# Patient Record
Sex: Female | Born: 1995 | Race: White | Hispanic: No | Marital: Single | Smoking: Never smoker
Health system: Southern US, Community
[De-identification: ages and names within clinical notes are randomized; demographics above are authoritative.]

## PROBLEM LIST (undated history)

## (undated) DIAGNOSIS — J302 Other seasonal allergic rhinitis: Secondary | ICD-10-CM

## (undated) DIAGNOSIS — T7840XA Allergy, unspecified, initial encounter: Secondary | ICD-10-CM

## (undated) HISTORY — PX: TEAR DUCT PROBING: SHX793

## (undated) HISTORY — DX: Allergy, unspecified, initial encounter: T78.40XA

## (undated) HISTORY — PX: OTHER SURGICAL HISTORY: SHX169

## (undated) HISTORY — DX: Other seasonal allergic rhinitis: J30.2

---
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
## 2016-01-27 ENCOUNTER — Other Ambulatory Visit: Payer: Self-pay | Admitting: Physician Assistant

## 2016-01-27 DIAGNOSIS — J4520 Mild intermittent asthma, uncomplicated: Secondary | ICD-10-CM

## 2016-01-27 DIAGNOSIS — IMO0001 Reserved for inherently not codable concepts without codable children: Secondary | ICD-10-CM

## 2016-01-27 DIAGNOSIS — J302 Other seasonal allergic rhinitis: Secondary | ICD-10-CM

## 2016-01-27 MED ORDER — ALBUTEROL SULFATE HFA 108 (90 BASE) MCG/ACT IN AERS: 2.0000 | INHALATION_SPRAY | Freq: Four times a day (QID) | RESPIRATORY_TRACT | Status: DC | PRN

## 2016-01-27 MED ORDER — FLUTICASONE-SALMETEROL 250-50 MCG/DOSE IN AEPB: 1.0000 | INHALATION_SPRAY | Freq: Two times a day (BID) | RESPIRATORY_TRACT | Status: DC

## 2016-01-27 MED ORDER — CETIRIZINE HCL 10 MG PO TABS: 10.0000 mg | ORAL_TABLET | Freq: Every day | ORAL | Status: DC

## 2016-01-27 MED ORDER — DESOGESTREL-ETHINYL ESTRADIOL 0.15-0.02/0.01 MG (21/5) PO TABS: 1.0000 | ORAL_TABLET | Freq: Every day | ORAL | Status: DC

## 2016-01-27 MED ORDER — MONTELUKAST SODIUM 10 MG PO TABS: 10.0000 mg | ORAL_TABLET | Freq: Every day | ORAL | Status: DC

## 2016-02-02 ENCOUNTER — Encounter: Payer: Managed Care, Other (non HMO) | Admitting: Physician Assistant

## 2016-02-02 ENCOUNTER — Encounter: Payer: Self-pay | Admitting: Physician Assistant

## 2016-02-02 NOTE — Patient Instructions (Signed)
     IF you received an x-ray today, you will receive an invoice from [facility]. Please contact [facility] at [phone] with questions or concerns regarding your invoice.   IF you received labwork today, you will receive an invoice from [facility]. Please contact [facility] at [phone] with questions or concerns regarding your invoice.   Our billing staff will not be able to assist you with questions regarding bills from these companies.  You will be contacted with the lab results as soon as they are available. The fastest way to get your results is to activate your My Chart account. Instructions are located on the last page of this paperwork. If you have not heard from us regarding the results in 2 weeks, please contact this office.      

## 2016-02-03 ENCOUNTER — Telehealth: Payer: Self-pay

## 2016-02-03 DIAGNOSIS — IMO0001 Reserved for inherently not codable concepts without codable children: Secondary | ICD-10-CM

## 2016-02-03 DIAGNOSIS — J4520 Mild intermittent asthma, uncomplicated: Secondary | ICD-10-CM

## 2016-02-03 DIAGNOSIS — J302 Other seasonal allergic rhinitis: Secondary | ICD-10-CM

## 2016-02-03 MED ORDER — CETIRIZINE HCL 10 MG PO TABS: 10.0000 mg | ORAL_TABLET | Freq: Every day | ORAL | 0 refills | Status: DC

## 2016-02-03 MED ORDER — FLUTICASONE-SALMETEROL 250-50 MCG/DOSE IN AEPB: 1.0000 | INHALATION_SPRAY | Freq: Two times a day (BID) | RESPIRATORY_TRACT | 0 refills | Status: DC

## 2016-02-03 MED ORDER — DESOGESTREL-ETHINYL ESTRADIOL 0.15-0.02/0.01 MG (21/5) PO TABS: 1.0000 | ORAL_TABLET | Freq: Every day | ORAL | 0 refills | Status: DC

## 2016-02-03 MED ORDER — ALBUTEROL SULFATE HFA 108 (90 BASE) MCG/ACT IN AERS: 2.0000 | INHALATION_SPRAY | Freq: Four times a day (QID) | RESPIRATORY_TRACT | 0 refills | Status: AC | PRN

## 2016-02-03 MED ORDER — MONTELUKAST SODIUM 10 MG PO TABS: 10.0000 mg | ORAL_TABLET | Freq: Every day | ORAL | 0 refills | Status: DC

## 2016-02-03 NOTE — Telephone Encounter (Signed)
Pt's mother called because pt tried to get in to see Maralyn Sago yesterday before going back to schoool and couldn't wait due to work. Pt was told that all of her Rxs were already sent in. In review, the RFs were sent to [facility] which mother states they no longer use. The RFs need to go to [facility]. Advised I will resend there and have pt get back in to see Maralyn Sago as soon as possible for f/up. Mother asks for 90 day supplies because they are less expensive.

## 2016-02-04 NOTE — Progress Notes (Signed)
Pt was triaged but had to leave prior to being seen by provider.  This encounter was created in error - please disregard.

## 2016-04-18 ENCOUNTER — Other Ambulatory Visit: Payer: Self-pay | Admitting: Physician Assistant

## 2016-04-18 NOTE — Telephone Encounter (Signed)
Unable to reach patient to see what follow/up plan is

## 2016-04-20 ENCOUNTER — Other Ambulatory Visit: Payer: Self-pay | Admitting: Physician Assistant

## 2016-04-20 MED ORDER — DESOGESTREL-ETHINYL ESTRADIOL 0.15-0.02/0.01 MG (21/5) PO TABS: 1.0000 | ORAL_TABLET | Freq: Every day | ORAL | 0 refills | Status: DC

## 2016-04-30 ENCOUNTER — Other Ambulatory Visit: Payer: Self-pay | Admitting: Physician Assistant

## 2016-04-30 DIAGNOSIS — IMO0001 Reserved for inherently not codable concepts without codable children: Secondary | ICD-10-CM

## 2016-04-30 DIAGNOSIS — J4520 Mild intermittent asthma, uncomplicated: Secondary | ICD-10-CM

## 2016-04-30 DIAGNOSIS — J302 Other seasonal allergic rhinitis: Secondary | ICD-10-CM

## 2016-06-24 ENCOUNTER — Ambulatory Visit (INDEPENDENT_AMBULATORY_CARE_PROVIDER_SITE_OTHER): Payer: No Typology Code available for payment source | Admitting: Family Medicine

## 2016-06-24 VITALS — BP 104/66 | HR 65 | Temp 98.5°F | Resp 18 | Ht 66.01 in | Wt 168.0 lb

## 2016-06-24 DIAGNOSIS — R300 Dysuria: Secondary | ICD-10-CM | POA: Diagnosis not present

## 2016-06-24 DIAGNOSIS — Z23 Encounter for immunization: Secondary | ICD-10-CM

## 2016-06-24 LAB — POCT URINALYSIS DIP (MANUAL ENTRY)
BILIRUBIN UA: NEGATIVE
Bilirubin, UA: NEGATIVE
GLUCOSE UA: NEGATIVE
Nitrite, UA: POSITIVE — AB
PROTEIN UA: NEGATIVE
UROBILINOGEN UA: 0.2
pH, UA: 7

## 2016-06-24 LAB — POC MICROSCOPIC URINALYSIS (UMFC): Mucus: ABSENT

## 2016-06-24 MED ORDER — CEPHALEXIN 500 MG PO CAPS: 500.0000 mg | ORAL_CAPSULE | Freq: Two times a day (BID) | ORAL | 0 refills | Status: DC

## 2016-06-24 NOTE — Progress Notes (Signed)
Shelly Nichols is a 20 y.o. female who presents to [facility] today with complaints concerning for UTI:  1.  Concern for UTI: Shelly Nichols is a 20 y.o. female who complains of urinary frequency, urgency and dysuria x 3 days.  She denies any flank or back pain, fever, chills, vomiting, or abnormal vaginal discharge/itching/bleeding.  Does endorse some suprapubic pressure.  No history of incontinence.  Now with nocturia 1-2 as well, which is new for her.  The following portions of the patient's history were reviewed and updated as appropriate: allergies, current medications, past medical history, family and social history, and problem list.  Patient is a nonsmoker.    Past Medical History:  Diagnosis Date  . Allergy   . Asthma   . Seasonal allergies    Past Surgical History:  Procedure Laterality Date  . myringotomy     times 2  . right lens implant     age 89  . TEAR DUCT PROBING      Medications reviewed. Current Outpatient Prescriptions  Medication Sig Dispense Refill  . ADVAIR DISKUS 250-50 MCG/DOSE AEPB INHALE 1 PUFF INTO THE LUNGS EVERY 12 (TWELVE) HOURS. 180 each 0  . albuterol (PROVENTIL HFA;VENTOLIN HFA) 108 (90 Base) MCG/ACT inhaler Inhale 2 puffs into the lungs every 6 (six) hours as needed. 3 Inhaler 0  . cetirizine (ZYRTEC) 10 MG tablet TAKE 1 TABLET (10 MG TOTAL) BY MOUTH DAILY. 90 tablet 0  . desogestrel-ethinyl estradiol (VIORELE) 0.15-0.02/0.01 MG (21/5) tablet Take 1 tablet by mouth daily. 84 tablet 0  . ibuprofen (ADVIL,MOTRIN) 800 MG tablet Take 800 mg by mouth 2 (two) times daily. Reported on 11/09/2015    . montelukast (SINGULAIR) 10 MG tablet Take 1 tablet (10 mg total) by mouth at bedtime. 90 tablet 0  . triamcinolone (NASACORT AQ) 55 MCG/ACT AERO nasal inhaler Place 2 sprays into the nose daily. 1 Inhaler 0   No current facility-administered medications for this visit.     ROS as above otherwise neg.       Objective:   Physical Exam BP 104/66 (BP Location: Right Arm,  Patient Position: Sitting, Cuff Size: Small)   Pulse 65   Temp 98.5 F (36.9 C) (Oral)   Resp 18   Ht 5' 6.01" (1.677 m)   Wt 168 lb (76.2 kg)   LMP 06/02/2016   SpO2 99%   BMI 27.11 kg/m  Gen:  Alert, cooperative patient who appears stated age in no acute distress.  Vital signs reviewed. Mouth: MMM Cardiac:  Regular rate and rhythm without murmur auscultated.  Good S1/S2. Pulm:  Clear to auscultation bilaterally with good air movement.  No wheezes or rales noted.   Abdomen:  Soft/ND/NT.  No CVA tenderness bilaterally   Results for orders placed or performed in visit on 06/24/16  POCT Microscopic Urinalysis ([facility])  Result Value Ref Range   WBC,UR,HPF,POC Few (A) None WBC/hpf   RBC,UR,HPF,POC None None RBC/hpf   Bacteria None None, Too numerous to count   Mucus Absent Absent   Epithelial Cells, UR Per Microscopy Moderate (A) None, Too numerous to count cells/hpf  POCT urinalysis dipstick  Result Value Ref Range   Color, UA yellow yellow   Clarity, UA clear clear   Glucose, UA negative negative   Bilirubin, UA negative negative   Ketones, POC UA negative negative   Spec Grav, UA <=1.005    Blood, UA small (A) negative   pH, UA 7.0    Protein Ur, POC negative  negative   Urobilinogen, UA 0.2    Nitrite, UA Positive (A) Negative   Leukocytes, UA small (1+) (A) Negative     Imp/Plan: 1.  UTI: Treatment with keflex x 7 days. Treat based on symptoms and U/A. Pyridium prn. Call or return to clinic prn if these symptoms worsen or fail to improve as anticipated, or if she begins to have any back pain, fevers, or chills.

## 2016-06-24 NOTE — Addendum Note (Signed)
Addended byGwendolyn Grant: Delane Stalling H on: 06/24/2016 11:06 AM   Modules accepted: Orders

## 2016-06-24 NOTE — Patient Instructions (Addendum)
  It does indeed look like you have a urinary tract infection.  Take the Keflex twice daily for the next 7 days.   We will send this for culture to make sure the antibiotics are the right ones.    If you start having any worsening pain, vomiting, unable to hold down fluids, fevers, or other concerns, do not hesitate to come back or go to the ED after hours.    It was good to see you today!    IF you received an x-ray today, you will receive an invoice from [facility]. Please contact [facility] at [phone] with questions or concerns regarding your invoice.   IF you received labwork today, you will receive an invoice from [facility]. Please contact [facility] at [phone] with questions or concerns regarding your invoice.   Our billing staff will not be able to assist you with questions regarding bills from these companies.  You will be contacted with the lab results as soon as they are available. The fastest way to get your results is to activate your My Chart account. Instructions are located on the last page of this paperwork. If you have not heard from us regarding the results in 2 weeks, please contact this office.

## 2016-06-26 LAB — URINE CULTURE

## 2016-07-05 ENCOUNTER — Ambulatory Visit (INDEPENDENT_AMBULATORY_CARE_PROVIDER_SITE_OTHER): Payer: No Typology Code available for payment source | Admitting: Physician Assistant

## 2016-07-05 VITALS — BP 106/62 | HR 74 | Temp 98.9°F | Resp 16 | Ht 66.0 in | Wt 169.0 lb

## 2016-07-05 DIAGNOSIS — N946 Dysmenorrhea, unspecified: Secondary | ICD-10-CM

## 2016-07-05 NOTE — Progress Notes (Signed)
   Shelly CrockerMegan Nichols  MRN: [id number] DOB: 01/23/1996  Subjective:  Pt presents to clinic for medication refill.  She has noticed the last 6 months she has noticed that her menstrual cramping has started to increase which has been controlled since she started on OCP - over the last month is still not bothering her that much but the last month (she had a UTI at the same time) the cramps were unbearable and she does want that to continue.  She does not miss pills.   Review of Systems  Constitutional: Negative for chills and fever.  Genitourinary: Positive for menstrual problem.    Patient Active Problem List   Diagnosis Date Noted  . Low back pain 11/09/2015  . Asthma, moderate persistent, well-controlled   . Seasonal allergies     Current Outpatient Prescriptions on File Prior to Visit  Medication Sig Dispense Refill  . ADVAIR DISKUS 250-50 MCG/DOSE AEPB INHALE 1 PUFF INTO THE LUNGS EVERY 12 (TWELVE) HOURS. 180 each 0  . albuterol (PROVENTIL HFA;VENTOLIN HFA) 108 (90 Base) MCG/ACT inhaler Inhale 2 puffs into the lungs every 6 (six) hours as needed. 3 Inhaler 0  . cetirizine (ZYRTEC) 10 MG tablet TAKE 1 TABLET (10 MG TOTAL) BY MOUTH DAILY. 90 tablet 0  . desogestrel-ethinyl estradiol (VIORELE) 0.15-0.02/0.01 MG (21/5) tablet Take 1 tablet by mouth daily. 84 tablet 0  . ibuprofen (ADVIL,MOTRIN) 800 MG tablet Take 800 mg by mouth 2 (two) times daily. Reported on 11/09/2015    . montelukast (SINGULAIR) 10 MG tablet Take 1 tablet (10 mg total) by mouth at bedtime. 90 tablet 0  . triamcinolone (NASACORT AQ) 55 MCG/ACT AERO nasal inhaler Place 2 sprays into the nose daily. 1 Inhaler 0   No current facility-administered medications on file prior to visit.     No Known Allergies  Pt patients past, family and social history were reviewed and updated.   Objective:  BP 106/62 (BP Location: Right Arm, Patient Position: Sitting, Cuff Size: Normal)   Pulse 74   Temp 98.9 F (37.2 C)   Resp 16   Ht 5'  6" (1.676 m)   Wt 169 lb (76.7 kg)   LMP 06/29/2016   SpO2 99%   BMI 27.28 kg/m   Physical Exam  Constitutional: She is oriented to person, place, and time and well-developed, well-nourished, and in no distress.  HENT:  Head: Normocephalic and atraumatic.  Right Ear: Hearing and external ear normal.  Left Ear: Hearing and external ear normal.  Eyes: Conjunctivae are normal.  Neck: Normal range of motion.  Pulmonary/Chest: Effort normal.  Neurological: She is alert and oriented to person, place, and time. Gait normal.  Skin: Skin is warm and dry.  Psychiatric: Mood, memory, affect and judgment normal.  Vitals reviewed.   Assessment and Plan :  Dysmenorrhea - unsure why the last menses cramps were so significant other than the fact that the UTI might have worsened the pain - she will continue her current pack of pills (she is on the 1st week) and she will let me know what her cramps are like - if there are again really bad we will switch to a different pill with more estrogen.    Benny LennertSarah Natoshia Souter PA-C  [facility] 07/05/2016 7:09 PM

## 2016-07-05 NOTE — Patient Instructions (Signed)
     IF you received an x-ray today, you will receive an invoice from [facility]. Please contact  Radiology at [phone] with questions or concerns regarding your invoice.   IF you received labwork today, you will receive an invoice from [facility]. Please contact [facility] at [phone] with questions or concerns regarding your invoice.   Our billing staff will not be able to assist you with questions regarding bills from these companies.  You will be contacted with the lab results as soon as they are available. The fastest way to get your results is to activate your My Chart account. Instructions are located on the last page of this paperwork. If you have not heard from us regarding the results in 2 weeks, please contact this office.     

## 2016-07-30 ENCOUNTER — Other Ambulatory Visit: Payer: Self-pay | Admitting: Physician Assistant

## 2016-07-30 DIAGNOSIS — IMO0001 Reserved for inherently not codable concepts without codable children: Secondary | ICD-10-CM

## 2016-07-30 DIAGNOSIS — J4520 Mild intermittent asthma, uncomplicated: Secondary | ICD-10-CM

## 2016-07-30 DIAGNOSIS — J302 Other seasonal allergic rhinitis: Secondary | ICD-10-CM

## 2016-07-30 MED ORDER — DESOGESTREL-ETHINYL ESTRADIOL 0.15-0.02/0.01 MG (21/5) PO TABS: 1.0000 | ORAL_TABLET | Freq: Every day | ORAL | 3 refills | Status: DC

## 2016-07-30 NOTE — Telephone Encounter (Signed)
Last ov for allergy 04/2015 needs ov

## 2016-09-04 ENCOUNTER — Other Ambulatory Visit: Payer: Self-pay | Admitting: Physician Assistant

## 2016-09-04 DIAGNOSIS — J302 Other seasonal allergic rhinitis: Secondary | ICD-10-CM

## 2016-09-04 DIAGNOSIS — IMO0001 Reserved for inherently not codable concepts without codable children: Secondary | ICD-10-CM

## 2016-09-04 DIAGNOSIS — J4520 Mild intermittent asthma, uncomplicated: Secondary | ICD-10-CM

## 2016-12-26 ENCOUNTER — Encounter: Payer: Self-pay | Admitting: Physician Assistant

## 2016-12-26 ENCOUNTER — Ambulatory Visit (INDEPENDENT_AMBULATORY_CARE_PROVIDER_SITE_OTHER): Payer: Managed Care, Other (non HMO) | Admitting: Physician Assistant

## 2016-12-26 VITALS — BP 108/71 | HR 60 | Temp 98.4°F | Resp 18 | Ht 66.0 in | Wt 158.6 lb

## 2016-12-26 DIAGNOSIS — N898 Other specified noninflammatory disorders of vagina: Secondary | ICD-10-CM

## 2016-12-26 MED ORDER — MONTELUKAST SODIUM 10 MG PO TABS: 10.0000 mg | ORAL_TABLET | Freq: Every day | ORAL | 3 refills | Status: AC

## 2016-12-26 NOTE — Progress Notes (Signed)
Shelly Nichols  MRN: [id number] DOB: 1995/07/23  PCP: Morrell Riddle, PA-C  Chief Complaint  Patient presents with  . female issues    Subjective:  Pt presents to clinic for burning sensation that occurs during every sexual encounter for the last about 7 months.  The pain occurs in any position but it is more on her left and more inside of her vagina vs at the introitus.  It does get more uncomfortable the longer she has sex.  Uses condoms - has tried without condoms but that does not to discomfort.  With only 1 partner - she has not had STD testing.  She is having no vaginal discharge or dysuria.  Review of Systems  Genitourinary: Positive for vaginal pain (just during sex - burning sensation). Negative for menstrual problem and vaginal discharge.    Patient Active Problem List   Diagnosis Date Noted  . Low back pain 11/09/2015  . Asthma, moderate persistent, well-controlled   . Seasonal allergies     Current Outpatient Prescriptions on File Prior to Visit  Medication Sig Dispense Refill  . ADVAIR DISKUS 250-50 MCG/DOSE AEPB INHALE 1 PUFF INTO THE LUNGS EVERY 12 (TWELVE) HOURS. 180 each 1  . albuterol (PROVENTIL HFA;VENTOLIN HFA) 108 (90 Base) MCG/ACT inhaler Inhale 2 puffs into the lungs every 6 (six) hours as needed. 3 Inhaler 0  . cetirizine (ZYRTEC) 10 MG tablet TAKE 1 TABLET (10 MG TOTAL) BY MOUTH DAILY. 90 tablet 1  . desogestrel-ethinyl estradiol (VIORELE) 0.15-0.02/0.01 MG (21/5) tablet Take 1 tablet by mouth daily. 84 tablet 3  . ibuprofen (ADVIL,MOTRIN) 800 MG tablet Take 800 mg by mouth 2 (two) times daily. Reported on 11/09/2015    . triamcinolone (NASACORT AQ) 55 MCG/ACT AERO nasal inhaler Place 2 sprays into the nose daily. 1 Inhaler 0   No current facility-administered medications on file prior to visit.     No Known Allergies  Pt patients past, family and social history were reviewed and updated.   Objective:  BP 108/71   Pulse 60   Temp 98.4 F (36.9 C) (Oral)    Resp 18   Ht 5\' 6"  (1.676 m)   Wt 158 lb 9.6 oz (71.9 kg)   LMP 12/12/2016   SpO2 99%   BMI 25.60 kg/m   Physical Exam  Constitutional: She is oriented to person, place, and time and well-developed, well-nourished, and in no distress.  HENT:  Head: Normocephalic and atraumatic.  Right Ear: Hearing and external ear normal.  Left Ear: Hearing and external ear normal.  Eyes: Conjunctivae are normal.  Neck: Normal range of motion.  Cardiovascular: Normal rate, regular rhythm and normal heart sounds.   No murmur heard. Pulmonary/Chest: Effort normal and breath sounds normal. She has no wheezes.  Genitourinary: Uterus normal, cervix normal, right adnexa normal and left adnexa normal. Vulva exhibits erythema. Thick  white and vaginal discharge found.  Genitourinary Comments: Erythema between the labia majora and minora with thick discharge.  No lesions on the skin.   Neurological: She is alert and oriented to person, place, and time. Gait normal.  Skin: Skin is warm and dry.  Psychiatric: Mood, memory, affect and judgment normal.  Vitals reviewed.   Assessment and Plan :  Vaginal irritation - Plan: WET PREP FOR TRICH, YEAST, CLUE  Check for yeast and BV.  Encourage thick lubrication to help with decreased friction.  She will seek free STD testing as she does not want it run on her insurance.  She will f/u if things do not improve.  Benny LennertSarah Kalei Mckillop PA-C  [facility] 12/26/2016 11:28 AM

## 2016-12-26 NOTE — Patient Instructions (Signed)
     IF you received an x-ray today, you will receive an invoice from [facility]. Please contact [facility] at [phone] with questions or concerns regarding your invoice.   IF you received labwork today, you will receive an invoice from [facility]. Please contact [facility] at [phone] with questions or concerns regarding your invoice.   Our billing staff will not be able to assist you with questions regarding bills from these companies.  You will be contacted with the lab results as soon as they are available. The fastest way to get your results is to activate your My Chart account. Instructions are located on the last page of this paperwork. If you have not heard from us regarding the results in 2 weeks, please contact this office.     

## 2016-12-28 LAB — WET PREP FOR TRICH, YEAST, CLUE
Clue Cell Exam: NEGATIVE
TRICHOMONAS EXAM: NEGATIVE
Yeast Exam: NEGATIVE

## 2017-05-20 ENCOUNTER — Other Ambulatory Visit: Payer: Self-pay | Admitting: Physician Assistant

## 2017-10-17 ENCOUNTER — Other Ambulatory Visit: Payer: Self-pay | Admitting: Physician Assistant

## 2017-10-17 DIAGNOSIS — J302 Other seasonal allergic rhinitis: Secondary | ICD-10-CM

## 2017-10-17 NOTE — Telephone Encounter (Signed)
Zyrtec refill request  LOV 04/19/15 with Benny LennertSarah Weber where this is addressed.  [facility] [address] [place] [place] [facility] [id number]

## 2018-01-17 ENCOUNTER — Other Ambulatory Visit: Payer: Self-pay | Admitting: Physician Assistant

## 2018-01-17 DIAGNOSIS — J4520 Mild intermittent asthma, uncomplicated: Secondary | ICD-10-CM

## 2018-01-17 DIAGNOSIS — IMO0001 Reserved for inherently not codable concepts without codable children: Secondary | ICD-10-CM

## 2018-01-17 MED ORDER — FLUTICASONE-SALMETEROL 250-50 MCG/DOSE IN AEPB: 1.0000 | INHALATION_SPRAY | Freq: Two times a day (BID) | RESPIRATORY_TRACT | 1 refills | Status: AC

## 2018-04-12 ENCOUNTER — Other Ambulatory Visit: Payer: Self-pay | Admitting: Physician Assistant

## 2018-04-30 ENCOUNTER — Telehealth: Payer: Self-pay | Admitting: Physician Assistant

## 2018-04-30 NOTE — Telephone Encounter (Signed)
Copied from CRM [id number]. Topic: General - Other >> Apr 30, 2018  3:44 PM Ronney Lion A wrote: Medication: KARIVA 0.15-0.02/0.01 MG (21/5) tablet   Has the patient contacted their pharmacy? Yes  Preferred Pharmacy (with phone number or street name):  [phone] [place], [place] - [address]  [phone] (Phone) [phone] (Fax)    Agent: Please be advised that RX refills may take up to 3 business days. We ask that you follow-up with your pharmacy.

## 2018-04-30 NOTE — Telephone Encounter (Signed)
Requested medication (s) are due for refill today -yes  Requested medication (s) are on the active medication list -yes  Future visit scheduled -no  Last refill: written 1 year ago  Notes to clinic: Attempted to call patient - but could not leave message. Was going to send courtesy refill- 1 month with note needs appointment- but Rx written by Herma Ard. Will need another provider to approve.   Requested Prescriptions  Pending Prescriptions Disp Refills   desogestrel-ethinyl estradiol (KARIVA) 0.15-0.02/0.01 MG (21/5) tablet 84 tablet 3    Sig: Take 1 tablet by mouth daily.     OB/GYN:  Contraceptives Failed - 04/30/2018  4:05 PM      Failed - Valid encounter within last 12 months    Recent Outpatient Visits          1 year ago Vaginal irritation   [facility], Dema Severin, PA-C   1 year ago Dysmenorrhea   [facility], Dema Severin, PA-C   1 year ago Dysuria   [facility], Newt Lukes, MD   2 years ago Midline low back pain, with sciatica presence unspecified   [facility] at [facility] [place], DO   3 years ago Seasonal allergies   [facility], Dema Severin, PA-C             Passed - Last BP in normal range    BP Readings from Last 1 Encounters:  12/26/16 108/71          Requested Prescriptions  Pending Prescriptions Disp Refills   desogestrel-ethinyl estradiol (KARIVA) 0.15-0.02/0.01 MG (21/5) tablet 84 tablet 3    Sig: Take 1 tablet by mouth daily.     OB/GYN:  Contraceptives Failed - 04/30/2018  4:05 PM      Failed - Valid encounter within last 12 months    Recent Outpatient Visits          1 year ago Vaginal irritation   [facility], Dema Severin, PA-C   1 year ago Dysmenorrhea   [facility], Dema Severin, PA-C   1 year ago Dysuria   [facility], Newt Lukes, MD   2 years ago Midline low back pain, with sciatica presence unspecified   [facility] at [facility] [place] R, DO   3 years ago Seasonal allergies   [facility], Dema Severin, PA-C             Passed - Last BP in normal range    BP Readings from Last 1 Encounters:  12/26/16 108/71

## 2018-05-07 NOTE — Telephone Encounter (Signed)
Pt calling to check status of this medication. Advised pt that she may need office visit-pt declined stating that she now lives in [place] and will have to find another provider if this is not filled soon. Please advise.

## 2018-05-08 NOTE — Telephone Encounter (Signed)
Attempted to call pt VM has not been set up Former pt of Weber -  If pt returns call, please ask her to set up with provider in her area. Thank you
